# Patient Record
Sex: Female | Born: 1995 | Race: White | Hispanic: No | Marital: Single | Smoking: Never smoker
Health system: Southern US, Community
[De-identification: ages and names within clinical notes are randomized; demographics above are authoritative.]

---
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
## 2017-01-11 ENCOUNTER — Encounter: Payer: Self-pay | Admitting: Pediatrics

## 2017-01-11 ENCOUNTER — Ambulatory Visit (INDEPENDENT_AMBULATORY_CARE_PROVIDER_SITE_OTHER): Payer: Medicaid Other | Admitting: Pediatrics

## 2017-01-11 VITALS — BP 120/85 | HR 59 | Ht 63.39 in | Wt 103.0 lb

## 2017-01-11 DIAGNOSIS — Z3042 Encounter for surveillance of injectable contraceptive: Secondary | ICD-10-CM

## 2017-01-11 DIAGNOSIS — F640 Transsexualism: Secondary | ICD-10-CM

## 2017-01-11 DIAGNOSIS — R634 Abnormal weight loss: Secondary | ICD-10-CM | POA: Diagnosis not present

## 2017-01-11 DIAGNOSIS — Z789 Other specified health status: Secondary | ICD-10-CM

## 2017-01-11 LAB — CBC WITH DIFFERENTIAL/PLATELET
BASOS PCT: 0 %
Basophils Absolute: 0 cells/uL (ref 0–200)
EOS PCT: 0 %
Eosinophils Absolute: 0 cells/uL — ABNORMAL LOW (ref 15–500)
HCT: 47.1 % — ABNORMAL HIGH (ref 34.0–46.0)
Hemoglobin: 15.9 g/dL — ABNORMAL HIGH (ref 11.5–15.3)
LYMPHS PCT: 20 %
Lymphs Abs: 1800 cells/uL (ref 1200–5200)
MCH: 30.3 pg (ref 25.0–35.0)
MCHC: 33.8 g/dL (ref 31.0–36.0)
MCV: 89.7 fL (ref 78.0–98.0)
MONOS PCT: 7 %
MPV: 10.5 fL (ref 7.5–12.5)
Monocytes Absolute: 630 cells/uL (ref 200–900)
Neutro Abs: 6570 cells/uL (ref 1800–8000)
Neutrophils Relative %: 73 %
PLATELETS: 256 10*3/uL (ref 140–400)
RBC: 5.25 MIL/uL — ABNORMAL HIGH (ref 3.80–5.10)
RDW: 13.3 % (ref 11.0–15.0)
WBC: 9 10*3/uL (ref 4.5–13.0)

## 2017-01-11 LAB — COMPREHENSIVE METABOLIC PANEL
ALBUMIN: 4.8 g/dL (ref 3.6–5.1)
ALK PHOS: 69 U/L (ref 47–176)
ALT: 21 U/L (ref 5–32)
AST: 25 U/L (ref 12–32)
BILIRUBIN TOTAL: 0.8 mg/dL (ref 0.2–1.1)
BUN: 8 mg/dL (ref 7–20)
CO2: 28 mmol/L (ref 20–31)
CREATININE: 0.79 mg/dL (ref 0.50–1.00)
Calcium: 9.8 mg/dL (ref 8.9–10.4)
Chloride: 106 mmol/L (ref 98–110)
Glucose, Bld: 75 mg/dL (ref 65–99)
Potassium: 4.9 mmol/L (ref 3.8–5.1)
SODIUM: 143 mmol/L (ref 135–146)
TOTAL PROTEIN: 7.3 g/dL (ref 6.3–8.2)

## 2017-01-11 LAB — LIPID PANEL
CHOL/HDL RATIO: 1.8 ratio (ref <5.0)
Cholesterol: 139 mg/dL (ref <170)
HDL: 77 mg/dL (ref >45)
LDL CALC: 52 mg/dL (ref <110)
Triglycerides: 52 mg/dL (ref <90)
VLDL: 10 mg/dL (ref <30)

## 2017-01-11 MED ORDER — "NEEDLE (DISP) 25G X 1"" MISC": 3 refills | Status: DC

## 2017-01-11 MED ORDER — TESTOSTERONE CYPIONATE 200 MG/ML IM SOLN: INTRAMUSCULAR | 0 refills | Status: DC

## 2017-01-11 MED ORDER — MEDROXYPROGESTERONE ACETATE 150 MG/ML IM SUSP
150.0000 mg | Freq: Once | INTRAMUSCULAR | Status: AC
  Administered 2017-01-11: 150 mg via INTRAMUSCULAR

## 2017-01-11 NOTE — Progress Notes (Signed)
THIS RECORD MAY CONTAIN CONFIDENTIAL INFORMATION THAT SHOULD NOT BE RELEASED WITHOUT REVIEW OF THE SERVICE PROVIDER.  Adolescent Medicine Consultation Follow-Up Visit Wendy Navarro  is a 21 y.o. female referred by Ezzard Flax, MD here today for follow-up regarding MTF trans person, hormone therapy.     Last seen in [facility] on 05/2016 for med management.  Plan at last visit included continue testosterone and depo.  - Pertinent Labs? No - Growth Chart Viewed? yes   History was provided by the patient.  PCP Confirmed?  yes  My Chart Activated?   no   Chief Complaint  Patient presents with  . Follow-up  . Medication Refill  . Contraception    HPI:   Not taking abilify or zoloft anymore. Feels a lot happier since stopping them. Sleep schedule is really good and is not getting irritated easily.  Lost insurance and not seeing therapist.  Had period about 2 months ago but it lasted way longer than it should hav-e about 1 month. Have some significant cramping with it. Worried about it coming back.  About 2 weeks late on testosterone. Before he left here had two vials that were mostly full so it has lasted longer. Sometimes would skip a dose to make it last.  Graduated and is working at [employer] at [place] to save up for a car and think about college. Wants to study [employer].  Eating a lot healthier. Avoiding sugars. Wants to be vegetarian but still eats meat at times. Eats fish still. Avoids red meats.   Still looking at top surgery. Wants to call Dr. Althia Forts in [place].    PHQ-SADS 01/11/2017  PHQ-15 1  GAD-7 0  PHQ-9 0  Suicidal Ideation No   Review of Systems  Constitutional: Negative for malaise/fatigue.  Eyes: Negative for double vision.  Respiratory: Negative for shortness of breath.   Cardiovascular: Negative for chest pain and palpitations.  Gastrointestinal: Negative for abdominal pain, constipation, diarrhea, nausea and vomiting.   Genitourinary: Negative for dysuria.  Musculoskeletal: Negative for joint pain and myalgias.  Skin: Negative for rash.  Neurological: Negative for dizziness and headaches.  Endo/Heme/Allergies: Does not bruise/bleed easily.     No LMP recorded. Allergies  Allergen Reactions  . Cephalosporins Rash   Outpatient Medications Prior to Visit  Medication Sig Dispense Refill  . ARIPiprazole (ABILIFY) 5 MG tablet Take 5 mg by mouth daily.    . Lidocaine-Hydrocortisone Ace 2-2 % KIT Insert one 'Rectal Rocket' PR qHS x 3 nights. 3 each 0  . NEEDLE, DISP, 25 G (B-D DISP NEEDLE 25GX1") 25G X 1" MISC Use q 2 weeks for IM injection of testosterone 50 each 3  . sertraline (ZOLOFT) 50 MG tablet Take 50 mg by mouth daily.    . Syringe, Disposable, 3 ML MISC Use to inject Testosterone as prescribed q 2 weeks 100 each 0  . testosterone cypionate (DEPOTESTOSTERONE CYPIONATE) 200 MG/ML injection INJECT 0.75 MILLILITERS (150MG) INTO THEMUSCLE ONCE EVERY 2 WEEKS 10 mL 0  . triamcinolone ointment (KENALOG) 0.1 % Apply 1 application topically 2 (two) times daily as needed. Do not use on face. Use sparingly. (Patient not taking: Reported on 03/24/2016) 80 g 1   No facility-administered medications prior to visit.      Patient Active Problem List   Diagnosis Date Noted  . 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 Moderate hearing loss of left ear 11/26/2015  . Hemorrhoid 11/26/2015  . Nummular eczema 11/26/2015  . Encounter  for management and injection of injectable progestin contraceptive 05/08/2015  . BMI (body mass index), pediatric, 5% to less than 85% for age 08/15/2014  . Adjustment disorder with mixed anxiety and depressed mood 10/15/2014     Confidentiality was discussed with the patient and if applicable, with caregiver as well.  Tobacco?  no Drugs/ETOH?  yes, MJ occasionally  Partner preference?  female Sexually Active?  no  Pregnancy Prevention:  depo-provera, reviewed condoms & plan  B Trauma currently or in the pastt?  no Suicidal or Self-Harm thoughts?   no Guns in the home?  no    The following portions of the patient's history were reviewed and updated as appropriate: allergies, current medications, past family history, past medical history, past social history and problem list.  Physical Exam:  Vitals:   01/11/17 1531  BP: 120/85  Pulse: (!) 59  Weight: 103 lb (46.7 kg)  Height: 5' 3.39" (1.61 m)   BP 120/85 (BP Location: Right Arm, Patient Position: Sitting, Cuff Size: Large)   Pulse (!) 59   Ht 5' 3.39" (1.61 m)   Wt 103 lb (46.7 kg)   BMI 18.02 kg/m  Body mass index: body mass index is 18.02 kg/m. Blood pressure percentiles are 82 % systolic and 97 % diastolic based on NHBPEP's 4th Report. Blood pressure percentile targets: 90: 124/79, 95: 127/83, 99 + 5 mmHg: 140/95.   Physical Exam  Constitutional: She appears well-developed. No distress.  HENT:  Mouth/Throat: Oropharynx is clear and moist.  Neck: No thyromegaly present.  Cardiovascular: Normal rate and regular rhythm.   No murmur heard. Pulmonary/Chest: Breath sounds normal.  Abdominal: Soft. She exhibits no mass. There is no tenderness. There is no guarding.  Musculoskeletal: She exhibits no edema.  Lymphadenopathy:    She has no cervical adenopathy.  Neurological: She is alert.  Skin: Skin is warm. No rash noted.  Psychiatric: She has a normal mood and affect.  Nursing note and vitals reviewed.   Assessment/Plan: 1. Female-to-female transgender person Continue testosterone at 150 mg every two weeks IM. Repeat depo today. Discussed bleeding and previous labs. May need increase in T to further suppress estradiol. Last T 261. Will repeat labs today and adjust accordingly.  - testosterone cypionate (DEPOTESTOSTERONE CYPIONATE) 200 MG/ML injection; INJECT 0.75 MILLILITERS (150MG) INTO THEMUSCLE ONCE EVERY 2 WEEKS  Dispense: 10 mL; Refill: 0 - NEEDLE, DISP, 25 G (B-D DISP NEEDLE 25GX1") 25G X  1" MISC; Use q 2 weeks for IM injection of testosterone  Dispense: 50 each; Refill: 3 - Comprehensive metabolic panel - CBC with Differential/Platelet - Estradiol - Testos,Total,Free and SHBG (Female) - Lipid panel  2. Encounter for Depo-Provera contraception Will repeat depo today to suppress menstruation. If estradiol becomes suppressed but still bleeding could consider imaging.  - medroxyPROGESTERone (DEPO-PROVERA) injection 150 mg; Inject 1 mL (150 mg total) into the muscle once.  3. Weight loss Has been eating a healthier diet with less sugar. Discussed weight loss today and will monitor. Does not appear to be intentional.    Follow-up:  3 months or sooner if needed.   Medical decision-making:  >25 minutes spent face to face with patient with more than 50% of appointment spent discussing diagnosis, management, follow-up, and reviewing the plan of care as noted above.

## 2017-01-11 NOTE — Patient Instructions (Addendum)
[facility]- Luan Mooreplastic surgeon  [phone]  Continue same dose of testosterone  We will contact you with labs

## 2017-01-12 LAB — ESTRADIOL: Estradiol: 33 pg/mL

## 2017-01-18 LAB — TESTOS,TOTAL,FREE AND SHBG (FEMALE)
Sex Hormone Binding Glob.: 21 nmol/L (ref 17–124)
TESTOSTERONE,FREE: 33.6 pg/mL — AB (ref 0.1–6.4)
Testosterone,Total,LC/MS/MS: 238 ng/dL — ABNORMAL HIGH (ref 2–45)

## 2017-03-30 ENCOUNTER — Other Ambulatory Visit: Payer: Self-pay | Admitting: *Deleted

## 2017-03-30 NOTE — Telephone Encounter (Signed)
Requesting refill for testosterone be called in to [facility] on [address].

## 2017-04-01 ENCOUNTER — Other Ambulatory Visit: Payer: Self-pay | Admitting: Pediatrics

## 2017-04-01 DIAGNOSIS — F640 Transsexualism: Secondary | ICD-10-CM

## 2017-04-01 DIAGNOSIS — Z789 Other specified health status: Secondary | ICD-10-CM

## 2017-04-01 MED ORDER — TESTOSTERONE CYPIONATE 200 MG/ML IM SOLN: INTRAMUSCULAR | 0 refills | Status: DC

## 2017-04-01 NOTE — Telephone Encounter (Signed)
Attempted to contact patient to let them know, unable to leave a voicemail, will try again later.

## 2017-04-01 NOTE — Telephone Encounter (Signed)
Called in to pharmacy. Please let patient know it should be ready for pickup today

## 2017-04-05 ENCOUNTER — Ambulatory Visit: Payer: Medicaid Other | Admitting: Pediatrics

## 2017-06-01 ENCOUNTER — Telehealth: Payer: Self-pay

## 2017-06-01 NOTE — Telephone Encounter (Signed)
Pt called requesting refill of Testosterone. Pt also requesting shot records to be faxed to pharmacy as well. Forwarding to Barnes & NobleCaroline Hacker,NP to review med refill request.

## 2017-06-01 NOTE — Telephone Encounter (Signed)
Returned patients call, no answer, left VM to call office and press option to schedule appointment with provider for f/u. At visit, they could do work up that is necessary. Gave office call back number to call and schedule.

## 2017-06-01 NOTE — Telephone Encounter (Signed)
Called and left VM for patient to call office back to schedule f/u appointment before medication refill request could be approved. Will keep shot records in red pod nurse office until appointment date so patient can pick up then.

## 2017-06-01 NOTE — Telephone Encounter (Signed)
Patient missed last visit and needs labs before med refill can be provided

## 2017-06-07 ENCOUNTER — Ambulatory Visit (INDEPENDENT_AMBULATORY_CARE_PROVIDER_SITE_OTHER): Payer: Medicaid Other | Admitting: Pediatrics

## 2017-06-07 ENCOUNTER — Encounter: Payer: Self-pay | Admitting: Pediatrics

## 2017-06-07 VITALS — BP 114/72 | HR 48 | Ht 63.5 in | Wt 109.2 lb

## 2017-06-07 DIAGNOSIS — Z113 Encounter for screening for infections with a predominantly sexual mode of transmission: Secondary | ICD-10-CM | POA: Diagnosis not present

## 2017-06-07 DIAGNOSIS — F4323 Adjustment disorder with mixed anxiety and depressed mood: Secondary | ICD-10-CM | POA: Diagnosis not present

## 2017-06-07 DIAGNOSIS — Z789 Other specified health status: Secondary | ICD-10-CM

## 2017-06-07 DIAGNOSIS — Z3042 Encounter for surveillance of injectable contraceptive: Secondary | ICD-10-CM | POA: Diagnosis not present

## 2017-06-07 DIAGNOSIS — Z5181 Encounter for therapeutic drug level monitoring: Secondary | ICD-10-CM | POA: Diagnosis not present

## 2017-06-07 DIAGNOSIS — F640 Transsexualism: Secondary | ICD-10-CM | POA: Diagnosis not present

## 2017-06-07 LAB — COMPREHENSIVE METABOLIC PANEL
ALBUMIN: 4.6 g/dL (ref 3.6–5.1)
ALK PHOS: 68 U/L (ref 47–176)
ALT: 7 U/L (ref 5–32)
AST: 12 U/L (ref 12–32)
BILIRUBIN TOTAL: 0.6 mg/dL (ref 0.2–1.1)
BUN: 6 mg/dL — ABNORMAL LOW (ref 7–20)
CALCIUM: 9.8 mg/dL (ref 8.9–10.4)
CO2: 24 mmol/L (ref 20–31)
Chloride: 105 mmol/L (ref 98–110)
Creat: 0.81 mg/dL (ref 0.50–1.00)
GLUCOSE: 86 mg/dL (ref 65–99)
POTASSIUM: 4.9 mmol/L (ref 3.8–5.1)
Sodium: 141 mmol/L (ref 135–146)
TOTAL PROTEIN: 7.1 g/dL (ref 6.3–8.2)

## 2017-06-07 LAB — CBC
HCT: 49 % — ABNORMAL HIGH (ref 34.0–46.0)
Hemoglobin: 16.5 g/dL — ABNORMAL HIGH (ref 11.5–15.3)
MCH: 30.6 pg (ref 25.0–35.0)
MCHC: 33.7 g/dL (ref 31.0–36.0)
MCV: 90.7 fL (ref 78.0–98.0)
MPV: 10.9 fL (ref 7.5–12.5)
PLATELETS: 296 10*3/uL (ref 140–400)
RBC: 5.4 MIL/uL — AB (ref 3.80–5.10)
RDW: 12.7 % (ref 11.0–15.0)
WBC: 7.7 10*3/uL (ref 4.5–13.0)

## 2017-06-07 MED ORDER — TESTOSTERONE CYPIONATE 200 MG/ML IM SOLN: INTRAMUSCULAR | 2 refills | Status: DC

## 2017-06-07 MED ORDER — MEDROXYPROGESTERONE ACETATE 150 MG/ML IM SUSP
150.0000 mg | Freq: Once | INTRAMUSCULAR | Status: AC
  Administered 2017-06-07: 150 mg via INTRAMUSCULAR

## 2017-06-07 NOTE — Progress Notes (Signed)
THIS RECORD MAY CONTAIN CONFIDENTIAL INFORMATION THAT SHOULD NOT BE RELEASED WITHOUT REVIEW OF THE SERVICE PROVIDER.  Adolescent Medicine Consultation Follow-Up Visit Wendy Navarro  is a 21 y.o. female referred by Ezzard Flax, MD here today for follow-up regarding FTM trans person, hormone therapy.    Last seen in [facility] on 01/2017 for med management.  Plan at last visit included continue testosterone and dope.  Pertinent Labs? No Growth Chart Viewed? yes   History was provided by the patient.  Interpreter? yes  PCP Confirmed?  yes  My Chart Activated?   no   Chief Complaint  Patient presents with  . Follow-up    HPI:    Wendy Navarro is doing well. States that he is very happy on his testosterone replacement. Ran out 3 weeks ago, so now off therapy x 1 week. Has been getting smaller vials than previously from his pharmacy and was curious about that. States that the vials get him only through one dose and he has to open a new vial to get enough for second dose. Recommended speaking to the pharmacist about this (probably getting 1 mL vials instead of the prescribed 10 mL vials).   Mood is good, though notes increased fluctuations in mood (anger, mood swings) when he misses a dose of testosterone. He is interested in the option to change from q2week dosing to q1week SQ injections for stabilization of hormone levels. Not on medication for depression/anxiety and feeling good about this.  Last period was a few months ago, states that it was short. He is interested in restarting depo therapy. Has recently moved to [facility] though, so will need to find a provider there to help with injections between his visits here (he would like to continue getting his main healthcare at our clinic).  Currently living in [place]. 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 Working for [employer]. Likes his job ok. [place].  Happy with his weight.  Eating three meals a day.  Per patient and mom, insurance not an issue at this time. Currently in the re-sign up period and think that he should be covered through age 89.   No LMP recorded (lmp unknown). Allergies  Allergen Reactions  . Cephalosporins Rash   Outpatient Medications Prior to Visit  Medication Sig Dispense Refill  . NEEDLE, DISP, 25 G (B-D DISP NEEDLE 25GX1") 25G X 1" MISC Use q 2 weeks for IM injection of testosterone 50 each 3  . Syringe, Disposable, 3 ML MISC Use to inject Testosterone as prescribed q 2 weeks 100 each 0  . testosterone cypionate (DEPOTESTOSTERONE CYPIONATE) 200 MG/ML injection INJECT 0.75 MILLILITERS (150MG) INTO THEMUSCLE ONCE EVERY 2 WEEKS 10 mL 0  . triamcinolone ointment (KENALOG) 0.1 % Apply 1 application topically 2 (two) times daily as needed. Do not use on face. Use sparingly. (Patient not taking: Reported on 03/24/2016) 80 g 1  . Lidocaine-Hydrocortisone Ace 2-2 % KIT Insert one 'Rectal Rocket' PR qHS x 3 nights. (Patient not taking: Reported on 06/07/2017) 3 each 0   No facility-administered medications prior to visit.      Patient Active Problem List   Diagnosis Date Noted  . 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 Moderate hearing loss of left ear 11/26/2015  . Hemorrhoid 11/26/2015  . Nummular eczema 11/26/2015  . Encounter for management and injection of injectable progestin contraceptive 05/08/2015  . BMI (body mass index), pediatric, 5% to less than 85% for age 63/09/2014  .  Adjustment disorder with mixed anxiety and depressed mood 10/15/2014    Social History: Changes with school since last visit?  Yes- graduated, awaiting start at [name] in the fall  Lifestyle habits that can impact QOL: Eating habits/patterns: 3 meals per day   Confidentiality was discussed with the patient and if applicable, with caregiver as well.  Changes at home or school since last visit:  Yes- as above  Gender identity: female Sex assigned at  birth: female Partner preference: males   The following portions of the patient's history were reviewed and updated as appropriate: allergies, current medications, past family history, past medical history, past social history, past surgical history and problem list.  Physical Exam:  Vitals:   06/07/17 1023  BP: 114/72  Pulse: (!) 48  Weight: 49.5 kg (109 lb 3.2 oz)  Height: 5' 3.5" (1.613 m)   Wt Readings from Last 3 Encounters:  06/07/17 49.5 kg (109 lb 3.2 oz) (16 %, Z= -1.01)*  01/11/17 46.7 kg (103 lb) (7 %, Z= -1.45)*  05/17/16 50.4 kg (111 lb 3.2 oz) (23 %, Z= -0.73)*   * Growth percentiles are based on CDC 2-20 Years data.    BP 114/72 (BP Location: Right Arm, Patient Position: Sitting, Cuff Size: Normal)   Pulse (!) 48   Ht 5' 3.5" (1.613 m)   Wt 49.5 kg (109 lb 3.2 oz)   LMP  (LMP Unknown)   BMI 19.04 kg/m  Body mass index: body mass index is 19.04 kg/m. Blood pressure percentiles are 58 % systolic and 77 % diastolic based on the August 2017 AAP Clinical Practice Guideline. Blood pressure percentile targets: 90: 126/78, 95: 129/81, 95 + 12 mmHg: 141/93.   Physical Exam  Constitutional: She is oriented to person, place, and time. She appears well-developed. No distress.  Thin  HENT:  Head: Normocephalic and atraumatic.  Eyes: Conjunctivae are normal. Right eye exhibits no discharge. Left eye exhibits no discharge. No scleral icterus.  Cardiovascular: Normal rate, regular rhythm, normal heart sounds and intact distal pulses.  Exam reveals no gallop and no friction rub.   No murmur heard. Pulmonary/Chest: Effort normal and breath sounds normal. No respiratory distress.  Abdominal: Soft. Bowel sounds are normal. She exhibits no distension. There is no tenderness.  Musculoskeletal: Normal range of motion. She exhibits no deformity.  Neurological: She is alert and oriented to person, place, and time. Coordination normal.  Skin: Skin is warm and dry. No rash noted. No  erythema.  Psychiatric: She has a normal mood and affect.  Nursing note and vitals reviewed.   Assessment/Plan: 1. Female-to-female transgender person - start SQ testosterone injections weekly - f/u in 3 months. Would like to stay with our clinic currently. Will be living in [place], so may eventually need to transition to a provider more close to his primary residence.  - testosterone cypionate (DEPOTESTOSTERONE CYPIONATE) 200 MG/ML injection; INJECT 0.35 MILLILITERS (70MG) INTO THE SUBCUTANEOUS TISSUE ONCE A WEEK  Dispense: 10 mL; Refill: 2  2. Adjustment disorder with mixed anxiety and depressed mood Doing well off medications. No change at this time.  3. Routine screening for STI (sexually transmitted infection) - GC/Chlamydia Probe Amp  4. Medication monitoring encounter- regular f/u for testosterone therapy  - Comprehensive metabolic panel - CBC - Testos,Total,Free and SHBG (Female) - Estradiol  5. Encounter for Depo-Provera contraception- would like to restart depo therapy - medroxyPROGESTERone (DEPO-PROVERA) injection 150 mg; Inject 1 mL (150 mg total) into the muscle once.   Follow-up:  Return in about 3 months (around 09/07/2017).   Medical decision-making:  >15 minutes spent face to face with patient with more than 50% of appointment spent discussing diagnosis, management, follow-up, and reviewing of FTM transgender hormone maintenance, depo contraception, anxiety/depressed mood.

## 2017-06-07 NOTE — Patient Instructions (Addendum)
Dr. Victorino DikeJennifer Abbott  [facility] [address] [place], [place] [address]  Start doing subcutaneous dosing of your testosterone once a week. You will do 0.35 ml now. Look at more videos of subcutaneous and practice like we did today.   We will see you in 3 months!

## 2017-06-08 LAB — ESTRADIOL: ESTRADIOL: 28 pg/mL

## 2017-06-10 LAB — GC/CHLAMYDIA PROBE AMP
CT Probe RNA: NOT DETECTED
GC Probe RNA: NOT DETECTED

## 2017-06-12 LAB — TESTOS,TOTAL,FREE AND SHBG (FEMALE)
Sex Hormone Binding Glob.: 22 nmol/L (ref 17–124)
TESTOSTERONE,FREE: 22.6 pg/mL — AB (ref 0.1–6.4)
TESTOSTERONE,TOTAL,LC/MS/MS: 146 ng/dL — AB (ref 2–45)

## 2017-09-05 ENCOUNTER — Ambulatory Visit: Payer: Self-pay | Admitting: Pediatrics

## 2017-11-19 ENCOUNTER — Other Ambulatory Visit: Payer: Self-pay | Admitting: Pediatrics

## 2017-11-19 DIAGNOSIS — Z789 Other specified health status: Secondary | ICD-10-CM

## 2017-11-19 DIAGNOSIS — F640 Transsexualism: Secondary | ICD-10-CM

## 2017-12-22 ENCOUNTER — Ambulatory Visit: Payer: Self-pay | Admitting: Pediatrics

## 2017-12-29 ENCOUNTER — Ambulatory Visit (INDEPENDENT_AMBULATORY_CARE_PROVIDER_SITE_OTHER): Payer: Medicaid Other | Admitting: Pediatrics

## 2017-12-29 ENCOUNTER — Encounter: Payer: Self-pay | Admitting: Pediatrics

## 2017-12-29 VITALS — BP 106/58 | HR 49 | Ht 63.78 in | Wt 107.9 lb

## 2017-12-29 DIAGNOSIS — F4323 Adjustment disorder with mixed anxiety and depressed mood: Secondary | ICD-10-CM

## 2017-12-29 DIAGNOSIS — F640 Transsexualism: Secondary | ICD-10-CM

## 2017-12-29 DIAGNOSIS — Z789 Other specified health status: Secondary | ICD-10-CM

## 2017-12-29 MED ORDER — SYRINGE (DISPOSABLE) 3 ML MISC: 0 refills | Status: DC

## 2017-12-29 MED ORDER — "NEEDLE (DISP) 25G X 1"" MISC": 3 refills | Status: DC

## 2017-12-29 MED ORDER — NORETHINDRONE ACETATE 5 MG PO TABS: 5.0000 mg | ORAL_TABLET | Freq: Every day | ORAL | 3 refills | Status: DC

## 2017-12-29 MED ORDER — TESTOSTERONE CYPIONATE 200 MG/ML IM SOLN: INTRAMUSCULAR | 0 refills | Status: DC

## 2017-12-29 NOTE — Progress Notes (Signed)
THIS RECORD MAY CONTAIN CONFIDENTIAL INFORMATION THAT SHOULD NOT BE RELEASED WITHOUT REVIEW OF THE SERVICE PROVIDER.  Adolescent Medicine Consultation Follow-Up Visit Wendy Navarro  is a 22 y.o. female referred by Kalman Jewels, MD here today for follow-up regarding FTM trans person hormone therapy   [facility] for med management  Plan at last visit included continue testosterone and Depo  Pertinent Labs? No Growth Chart Viewed? yes   History was provided by the patient and mother.  Interpreter? no  PCP Confirmed?  yes  My Chart Activated?   yes    Chief Complaint  Patient presents with  . Follow-up  . Medication Management    HPI:    Wendy Navarro reports that "things have been alright". He really would like to restart testosterone therapy. He has been out of testosterone since October. When he was on therapy, we preferred the q 2 week vs q 1 week.   Reports his mood is good, but it has been a little stressful. His mother's husband suddenly passed away. Because of this patient moved back to [place] from [place] where he was in school. He is currently working at [employer] and living with his parents to help them financially.   He reports that he had a heavy period about one week ago. Per chart review it looks like the last Depo was in July at the last visit. He reports he would still have some spotting while on Depo and Testosterone.    No LMP recorded. Allergies  Allergen Reactions  . Cephalosporins Rash   Outpatient Medications Prior to Visit  Medication Sig Dispense Refill  . triamcinolone ointment (KENALOG) 0.1 % Apply 1 application topically 2 (two) times daily as needed. Do not use on face. Use sparingly. (Patient not taking: Reported on 03/24/2016) 80 g 1  . NEEDLE, DISP, 25 G (B-D DISP NEEDLE 25GX1") 25G X 1" MISC Use q 2 weeks for IM injection of testosterone (Patient not taking: Reported on 12/29/2017) 50 each 3  . Syringe,  Disposable, 3 ML MISC Use to inject Testosterone as prescribed q 2 weeks (Patient not taking: Reported on 12/29/2017) 100 each 0  . testosterone cypionate (DEPOTESTOSTERONE CYPIONATE) 200 MG/ML injection INJECT 0.75 ML (150MG ) INTO THE MUSCLE EVERY 2 WEEKS (Patient not taking: Reported on 12/29/2017) 10 mL 0   No facility-administered medications prior to visit.      Patient Active Problem List   Diagnosis Date Noted  . 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 Moderate hearing loss of left ear 11/26/2015  . Hemorrhoid 11/26/2015  . Nummular eczema 11/26/2015  . Encounter for management and injection of injectable progestin contraceptive 05/08/2015  . BMI (body mass index), pediatric, 5% to less than 85% for age 85/09/2014  . Adjustment disorder with mixed anxiety and depressed mood 10/15/2014    Social History: Changes with school since last visit?  Yes, please note above   Lifestyle habits that can impact QOL: Sleep: sleeping is okay Eats 3 meals a day    Confidentiality was discussed with the patient and if applicable, with caregiver as well.  Changes at home or school since last visit:  yes  Gender identity: female Sex assigned at birth: female Partner preference?  female   Suicidal or homicidal thoughts?   no Self injurious behaviors?  no     The following portions of the patient's history were reviewed and updated as appropriate: allergies, current medications, past family history, past medical history, past social  history, past surgical history and problem list.  Physical Exam:  Vitals:   12/29/17 0918  BP: (!) 106/58  Pulse: (!) 49  Weight: 107 lb 14.4 oz (48.9 kg)  Height: 5' 3.78" (1.62 m)   BP (!) 106/58   Pulse (!) 49   Ht 5' 3.78" (1.62 m)   Wt 107 lb 14.4 oz (48.9 kg)   BMI 18.65 kg/m  Body mass index: body mass index is 18.65 kg/m. Blood pressure percentiles are 23 % systolic and 15 % diastolic based on the August 2017 AAP Clinical Practice  Guideline. Blood pressure percentile targets: 90: 127/78, 95: 129/81, 95 + 12 mmHg: 141/93.   Physical Exam  Constitutional: She is oriented to person, place, and time. She appears well-developed and well-nourished. No distress.  Eyes: Conjunctivae are normal.  Neck: Normal range of motion. Neck supple.  Cardiovascular: Normal rate and regular rhythm.  No murmur heard. Pulmonary/Chest: Effort normal and breath sounds normal.  Lymphadenopathy:    She has no cervical adenopathy.  Neurological: She is alert and oriented to person, place, and time.  Skin: Skin is warm and dry.  Psychiatric: She has a normal mood and affect. Her behavior is normal. Judgment and thought content normal.    Assessment/Plan:  1. Female-to-female transgender person Will restart testosterone as he has been out since October. Will not get labs today, but will obtain labs in 6 weeks. Follow up in clinic in 3 months  - testosterone cypionate (DEPOTESTOSTERONE CYPIONATE) 200 MG/ML injection; INJECT 0.75 ML (150MG ) INTO THE MUSCLE EVERY 2 WEEKS  Dispense: 10 mL; Refill: 0 - NEEDLE, DISP, 25 G (B-D DISP NEEDLE 25GX1") 25G X 1" MISC; Use q 2 weeks for IM injection of testosterone  Dispense: 50 each; Refill: 3 - Syringe, Disposable, 3 ML MISC; Use to inject Testosterone as prescribed q 2 weeks  Dispense: 100 each; Refill: 0 - start Aygestin 5mg  daily to help with spotting   BH screenings:  reviewed and indicated no depression or anxiety. Screens discussed with patient and parent and adjustments to plan made accordingly.   Follow-up:  Return in about 6 weeks (around 02/09/2018). for lab then in clinic in 3 months   Medical decision-making:  > 15 minutes spent face to face with patient with more than 50% of appointment spent discussing diagnosis, management, follow-up

## 2017-12-29 NOTE — Patient Instructions (Signed)
Thank you for coming in!  We have sent a refill to your pharmacy  Additionally, we discussed staring Aygestin to see if we can stop the irregular spotting. Please take this daily as prescribed.   Please follow up in clinic in 6 weeks

## 2018-02-02 ENCOUNTER — Other Ambulatory Visit: Payer: Medicaid Other

## 2018-03-22 ENCOUNTER — Ambulatory Visit: Payer: Medicaid Other | Admitting: Family

## 2018-10-02 ENCOUNTER — Telehealth: Payer: Self-pay

## 2018-10-02 NOTE — Telephone Encounter (Signed)
Rec'd refill request for pt's testosterone. Pt no showed back in April and never rescheduled. Called and left VM asking for pt to make appointment before any medications can be refilled.

## 2018-10-24 ENCOUNTER — Telehealth: Payer: Self-pay | Admitting: Pediatrics

## 2018-10-24 NOTE — Telephone Encounter (Signed)
Electronic refill request received for patient's testosterone. Needs scheduled appointment on the books prior to refill. We need to be seeing him at least every 6 months.

## 2018-10-24 NOTE — Telephone Encounter (Signed)
Called number on file and left message stating medication cannot be refilled until patient schedules an appointment. Asked for call back to schedule at his convenience.

## 2018-12-18 ENCOUNTER — Encounter: Payer: Self-pay | Admitting: Pediatrics

## 2018-12-18 ENCOUNTER — Ambulatory Visit (INDEPENDENT_AMBULATORY_CARE_PROVIDER_SITE_OTHER): Payer: Medicaid Other | Admitting: Pediatrics

## 2018-12-18 VITALS — BP 129/54 | HR 64 | Ht 63.78 in | Wt 111.8 lb

## 2018-12-18 DIAGNOSIS — Z789 Other specified health status: Secondary | ICD-10-CM

## 2018-12-18 DIAGNOSIS — Z3202 Encounter for pregnancy test, result negative: Secondary | ICD-10-CM

## 2018-12-18 DIAGNOSIS — Z3049 Encounter for surveillance of other contraceptives: Secondary | ICD-10-CM

## 2018-12-18 DIAGNOSIS — Z113 Encounter for screening for infections with a predominantly sexual mode of transmission: Secondary | ICD-10-CM

## 2018-12-18 DIAGNOSIS — F649 Gender identity disorder, unspecified: Secondary | ICD-10-CM

## 2018-12-18 DIAGNOSIS — F640 Transsexualism: Secondary | ICD-10-CM | POA: Diagnosis not present

## 2018-12-18 DIAGNOSIS — Z3042 Encounter for surveillance of injectable contraceptive: Secondary | ICD-10-CM

## 2018-12-18 LAB — POCT URINE PREGNANCY: PREG TEST UR: NEGATIVE

## 2018-12-18 MED ORDER — TESTOSTERONE CYPIONATE 200 MG/ML IM SOLN: INTRAMUSCULAR | 1 refills | Status: DC

## 2018-12-18 MED ORDER — MEDROXYPROGESTERONE ACETATE 150 MG/ML IM SUSP
150.0000 mg | Freq: Once | INTRAMUSCULAR | Status: AC
  Administered 2018-12-18: 150 mg via INTRAMUSCULAR

## 2018-12-18 NOTE — Patient Instructions (Addendum)
Start testosterone at 0.4 ml every week under your skin. Come back and have our nurse do your first shot and help you get the hang of the technique.   [facility]   We did depo here today. This should help stop your periods.   If we can get you back on insurance, we can get you to the surgeon!   [email or web address]

## 2018-12-18 NOTE — Progress Notes (Signed)
History was provided by the patient.  Wendy Navarro is a 23 y.o. adult who is here for [facility] care.  Kalman Jewels, MD   HPI:  Pt reports that he is back today to restart testosterone. Last took T not sure- sometime after the last visit.   Been worried about some physical changes- noticed his breast size is increasing and making him feel really dysphoric. Having a lot of mood swings as a result. He is tearful today in the office and has questions about insurance. He is willing to return for a finanacial counseling visit. He has tried to get back on medicaid, but they have said he needs to be doing some sort of schooling in addition to working. He is currently working for a [employer].   He is having periods that come every few months and continue to be more and more regular and heavier over time. He is interested in menstrual suppression again today. He is amenable to depo or aygestin.  No LMP recorded.  Review of Systems  Constitutional: Negative for malaise/fatigue.  Eyes: Negative for double vision.  Respiratory: Negative for shortness of breath.   Cardiovascular: Negative for chest pain and palpitations.  Gastrointestinal: Negative for abdominal pain, constipation, diarrhea, nausea and vomiting.  Genitourinary: Negative for dysuria.  Musculoskeletal: Negative for joint pain and myalgias.  Skin: Negative for rash.  Neurological: Negative for dizziness and headaches.  Endo/Heme/Allergies: Does not bruise/bleed easily.  Psychiatric/Behavioral: Positive for depression. The patient is nervous/anxious. The patient does not have insomnia.     Patient Active Problem List   Diagnosis Date Noted  . 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 Moderate hearing loss of left ear 11/26/2015  . Hemorrhoid 11/26/2015  . Nummular eczema 11/26/2015  . Encounter for management and injection of injectable progestin contraceptive 05/08/2015  . BMI (body mass index), pediatric, 5% to  less than 85% for age 39/09/2014  . Adjustment disorder with mixed anxiety and depressed mood 10/15/2014    Current Outpatient Medications on File Prior to Visit  Medication Sig Dispense Refill  . NEEDLE, DISP, 25 G (B-D DISP NEEDLE 25GX1") 25G X 1" MISC Use q 2 weeks for IM injection of testosterone (Patient not taking: Reported on 12/18/2018) 50 each 3  . norethindrone (AYGESTIN) 5 MG tablet Take 1 tablet (5 mg total) by mouth daily. (Patient not taking: Reported on 12/18/2018) 30 tablet 3  . Syringe, Disposable, 3 ML MISC Use to inject Testosterone as prescribed q 2 weeks (Patient not taking: Reported on 12/18/2018) 100 each 0  . testosterone cypionate (DEPOTESTOSTERONE CYPIONATE) 200 MG/ML injection INJECT 0.75 ML (150MG ) INTO THE MUSCLE EVERY 2 WEEKS (Patient not taking: Reported on 12/18/2018) 10 mL 0  . triamcinolone ointment (KENALOG) 0.1 % Apply 1 application topically 2 (two) times daily as needed. Do not use on face. Use sparingly. (Patient not taking: Reported on 03/24/2016) 80 g 1   No current facility-administered medications on file prior to visit.     Allergies  Allergen Reactions  . Cephalosporins Rash     Physical Exam:    Vitals:   12/18/18 1458  BP: (!) 129/54  Pulse: 64  Weight: 111 lb 12.8 oz (50.7 kg)  Height: 5' 3.78" (1.62 m)    Growth percentile SmartLinks can only be used for patients less than 77 years old.  Physical Exam Constitutional:      Appearance: He is well-developed.  HENT:     Head: Normocephalic.  Neck:     Thyroid:  No thyromegaly.  Cardiovascular:     Rate and Rhythm: Normal rate and regular rhythm.     Heart sounds: Normal heart sounds.  Pulmonary:     Effort: Pulmonary effort is normal.     Breath sounds: Normal breath sounds.  Abdominal:     General: Bowel sounds are normal.     Palpations: Abdomen is soft.     Tenderness: There is no abdominal tenderness.  Musculoskeletal: Normal range of motion.  Skin:    General: Skin is  warm and dry.  Neurological:     Mental Status: He is alert and oriented to person, place, and time.     Assessment/Plan: 1. Female-to-female transgender person Will switch to subq testosterone. He was comforatble with this today. He will return tomorrow for RN teaching- he has done once before but would like demonstration again.  - testosterone cypionate (DEPOTESTOSTERONE CYPIONATE) 200 MG/ML injection; INJECT 0.4 ML UNDER THE SKIN SUBCUTANEOUSLY EVERY WEEK  Dispense: 20 mL; Refill: 1  2. Gender dysphoria Tearful about breast growth in office today. We discussed potential options for insurance including school + medicaid, [place] marketplace or looking for employment at somewhere like [employer] that has good trans healthcare. He will return tomorrow for financial counseling.   3. Routine screening for STI (sexually transmitted infection) Per protocol.  - C. trachomatis/N. gonorrhoeae RNA  4. Encounter for Depo-Provera contraception Gave in office today- will cover under grant.  - medroxyPROGESTERone (DEPO-PROVERA) injection 150 mg  5. Pregnancy examination or test, negative result Per protocol.  - POCT urine pregnancy

## 2018-12-19 ENCOUNTER — Ambulatory Visit: Payer: Medicaid Other

## 2018-12-19 LAB — C. TRACHOMATIS/N. GONORRHOEAE RNA
C. trachomatis RNA, TMA: NOT DETECTED
N. gonorrhoeae RNA, TMA: NOT DETECTED

## 2019-01-26 ENCOUNTER — Telehealth: Payer: Self-pay | Admitting: *Deleted

## 2019-01-26 ENCOUNTER — Other Ambulatory Visit: Payer: Self-pay | Admitting: Family

## 2019-01-26 DIAGNOSIS — Z789 Other specified health status: Secondary | ICD-10-CM

## 2019-01-26 DIAGNOSIS — F640 Transsexualism: Secondary | ICD-10-CM

## 2019-01-26 MED ORDER — TESTOSTERONE CYPIONATE 200 MG/ML IM SOLN: INTRAMUSCULAR | 1 refills | Status: DC

## 2019-01-26 NOTE — Telephone Encounter (Signed)
Your patient 

## 2019-01-26 NOTE — Telephone Encounter (Signed)
Mom called and she is having trouble getting testoserone filled at pharmacy. She said the [facility], across from [facility] in [place], does carry it. Please escribe to them. It cannot be transferred from original pharmacy since it was never filled. Please call mom on mobile number with questions.

## 2019-06-20 ENCOUNTER — Telehealth: Payer: Self-pay

## 2019-06-20 NOTE — Telephone Encounter (Signed)
Phone call from patient's mom requesting a refill on Testosterone. Pharmacy used is [facility]. Voicemail forwarded to red pod nurse.

## 2019-06-21 NOTE — Telephone Encounter (Signed)
Called and left VM for patient letting them know that patient should have one refill remaining.

## 2019-08-06 ENCOUNTER — Other Ambulatory Visit: Payer: Self-pay | Admitting: Family

## 2019-08-06 DIAGNOSIS — Z789 Other specified health status: Secondary | ICD-10-CM

## 2019-08-06 DIAGNOSIS — F640 Transsexualism: Secondary | ICD-10-CM

## 2019-09-12 ENCOUNTER — Telehealth: Payer: Self-pay

## 2019-09-12 NOTE — Telephone Encounter (Signed)
Mom called asking for refill of IM needles. Routing to provider to assist.

## 2019-09-13 ENCOUNTER — Other Ambulatory Visit: Payer: Self-pay | Admitting: Pediatrics

## 2019-09-13 DIAGNOSIS — Z789 Other specified health status: Secondary | ICD-10-CM

## 2019-09-13 DIAGNOSIS — F640 Transsexualism: Secondary | ICD-10-CM

## 2019-09-13 MED ORDER — "BD DISP NEEDLE 25G X 1"" MISC": 3 refills | Status: DC

## 2019-09-13 NOTE — Telephone Encounter (Signed)
Sent to [facility]

## 2019-09-14 NOTE — Telephone Encounter (Signed)
Sent My-Chart message

## 2021-05-05 ENCOUNTER — Ambulatory Visit
Admission: EM | Admit: 2021-05-05 | Discharge: 2021-05-05 | Disposition: A | Discharge status: Home / Self Care | Payer: Self-pay | Attending: Family Medicine | Admitting: Family Medicine

## 2021-05-05 ENCOUNTER — Encounter: Payer: Self-pay | Admitting: Emergency Medicine

## 2021-05-05 ENCOUNTER — Other Ambulatory Visit: Payer: Self-pay

## 2021-05-05 DIAGNOSIS — K648 Other hemorrhoids: Secondary | ICD-10-CM

## 2021-05-05 MED ORDER — LIDOCAINE (ANORECTAL) 5 % EX GEL: CUTANEOUS | 0 refills | Status: AC

## 2021-05-05 MED ORDER — BETAMETHASONE DIPROPIONATE 0.05 % EX OINT: TOPICAL_OINTMENT | CUTANEOUS | 0 refills | Status: AC

## 2021-05-05 NOTE — ED Provider Notes (Signed)
[facility]    CSN: [id number] Arrival date & time: 05/05/21  1255      History   Chief Complaint Chief Complaint  Patient presents with  . Hemorrhoids    HPI Wendy Navarro is a 25 y.o. adult.   HPI  Patient in today for evaluation of hemorrhoids.  Patient reports pain with defecation.  They have had hemorrhoids in the past and has used creams and other topical solutions without any relief of symptoms. They have never been seen or consulted on by general surgery. History reviewed. No pertinent past medical history.  Patient Active Problem List   Diagnosis Date Noted  . 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 Moderate hearing loss of left ear 11/26/2015  . Hemorrhoid 11/26/2015  . Nummular eczema 11/26/2015  . Encounter for management and injection of injectable progestin contraceptive 05/08/2015  . BMI (body mass index), pediatric, 5% to less than 85% for age 74/09/2014  . Adjustment disorder with mixed anxiety and depressed mood 10/15/2014    History reviewed. No pertinent surgical history.  OB History   No obstetric history on file.      Home Medications    Prior to Admission medications   Medication Sig Start Date End Date Taking? Authorizing Provider  NEEDLE, DISP, 25 G (B-D DISP NEEDLE 25GX1") 25G X 1" MISC Use q 2 weeks for IM injection of testosterone 09/13/19   Alfonso Ramus T, FNP  testosterone cypionate (DEPOTESTOSTERONE CYPIONATE) 200 MG/ML injection INJECT 0.4ML SUB-Q ONCE A WEEK 08/07/19   Georges Mouse, NP    Family History History reviewed. No pertinent family history.  Social History Social History   Tobacco Use  . Smoking status: Passive Smoke Exposure - Never Smoker  . Smokeless tobacco: Never Used     Allergies   Cephalosporins   Review of Systems Review of Systems Pertinent negatives listed in HPI  Physical Exam Triage Vital Signs ED Triage Vitals  Enc Vitals Group     BP 05/05/21 1456 116/79      Pulse Rate 05/05/21 1456 70     Resp 05/05/21 1456 17     Temp 05/05/21 1456 98.5 F (36.9 C)     Temp Source 05/05/21 1456 Oral     SpO2 05/05/21 1456 98 %     Weight --      Height --      Head Circumference --      Peak Flow --      Pain Score 05/05/21 1455 8     Pain Loc --      Pain Edu? --      Excl. in GC? --    No data found.  Updated Vital Signs BP 116/79   Pulse 70   Temp 98.5 F (36.9 C) (Oral)   Resp 17   SpO2 98%   Visual Acuity Right Eye Distance:   Left Eye Distance:   Bilateral Distance:    Right Eye Near:   Left Eye Near:    Bilateral Near:     Physical Exam Constitutional:      Appearance: Normal appearance.  Cardiovascular:     Rate and Rhythm: Normal rate and regular rhythm.  Pulmonary:     Effort: Pulmonary effort is normal.     Breath sounds: Normal breath sounds.  Genitourinary:    Rectum: Guaiac result negative. Anal fissure and external hemorrhoid present. No internal hemorrhoid.  Skin:    General: Skin is warm.     Capillary  Refill: Capillary refill takes less than 2 seconds.  Neurological:     General: No focal deficit present.     Mental Status: He is alert.  Psychiatric:        Mood and Affect: Mood normal.        Behavior: Behavior normal.        Thought Content: Thought content normal.        Judgment: Judgment normal.    Chaperone present during rectal exam  UC Treatments / Results  Labs (all labs ordered are listed, but only abnormal results are displayed) Labs Reviewed - No data to display  EKG   Radiology No results found.  Procedures Procedures (including critical care time)  Medications Ordered in UC Medications - No data to display  Initial Impression / Assessment and Plan / UC Course  I have reviewed the triage vital signs and the nursing notes.  Pertinent labs & imaging results that were available during my care of the patient were reviewed by me and considered in my medical decision making (see  chart for details).    Given prior failures with topical hydrocortisone treatments.  Trialed a topical steroid along with a lidocaine gel for temporary relief of pain.  Advised patient to follow-up with general surgery for surgical consult for removal of hemorrhoids.   Final Clinical Impressions(s) / UC Diagnoses   Final diagnoses:  Other hemorrhoids   Discharge Instructions   None    ED Prescriptions    Medication Sig Dispense Auth. Provider   Lidocaine, Anorectal, 5 % GEL Apply a pea-sized amount to the affected area 3 times daily. 60 g Bing Neighbors, FNP   betamethasone dipropionate (DIPROLENE) 0.05 % ointment Apply pea-sized amount directly to the hemorrhoidal tissue.  Do not use for more than 3 days consecutively. 30 g Bing Neighbors, FNP     PDMP not reviewed this encounter.   Bing Neighbors, FNP 05/10/21 [phone]

## 2021-05-05 NOTE — ED Triage Notes (Addendum)
Pt is present today with hemorrhoids. Pt states that recently they noticed some bleeding and pain. Pt states they notices a sharp pain when sitting down.

## 2021-09-13 ENCOUNTER — Other Ambulatory Visit: Payer: Self-pay

## 2021-09-13 ENCOUNTER — Ambulatory Visit (HOSPITAL_COMMUNITY)
Admission: EM | Admit: 2021-09-13 | Discharge: 2021-09-13 | Disposition: A | Discharge status: Home / Self Care | Payer: Self-pay | Attending: Student | Admitting: Student

## 2021-09-13 ENCOUNTER — Encounter (HOSPITAL_COMMUNITY): Payer: Self-pay | Admitting: *Deleted

## 2021-09-13 DIAGNOSIS — H60502 Unspecified acute noninfective otitis externa, left ear: Secondary | ICD-10-CM

## 2021-09-13 MED ORDER — OFLOXACIN 0.3 % OT SOLN: 2.0000 [drp] | Freq: Two times a day (BID) | OTIC | 0 refills | Status: DC

## 2021-09-13 NOTE — ED Provider Notes (Signed)
[facility]    CSN: [id number] Arrival date & time: 09/13/21  1418      History   Chief Complaint Chief Complaint  Patient presents with   lt ear pain and drainage    HPI Wendy Navarro is a 25 y.o. adult presenting with L ear pain and drainage x months. Medical history L TM perforation requiring surgery years ago.  Describes over 2 months of yellow discharge from the ear with progressively worsening pain and muffled hearing.  Also some vertigo with movement, but none currently.  States that saw some blood from the ear today which concerned him.  Endorses muffled hearing but denies tinnitus.  Denies fever/chills.  HPI  History reviewed. No pertinent past medical history.  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 No pertinent surgical history.  OB History   No obstetric history on file.      Home Medications    Prior to Admission medications   Medication Sig Start Date End Date Taking? Authorizing Provider  ofloxacin (FLOXIN) 0.3 % OTIC solution Place 2 drops into the left ear 2 (two) times daily. 09/13/21  Yes Rhys Martini, PA-C  betamethasone dipropionate (DIPROLENE) 0.05 % ointment Apply pea-sized amount directly to the hemorrhoidal tissue.  Do not use for more than 3 days consecutively. 05/05/21   Bing Neighbors, FNP  Lidocaine, Anorectal, 5 % GEL Apply a pea-sized amount to the affected area 3 times daily. 05/05/21   Bing Neighbors, FNP  NEEDLE, DISP, 25 G (B-D DISP NEEDLE 25GX1") 25G X 1" MISC Use q 2 weeks for IM injection of  testosterone 09/13/19   Alfonso Ramus T, FNP  testosterone cypionate (DEPOTESTOSTERONE CYPIONATE) 200 MG/ML injection INJECT 0.4ML SUB-Q ONCE A WEEK 08/07/19   Georges Mouse, NP    Family History History reviewed. No pertinent family history.  Social History Social History   Tobacco Use   Smoking status: Passive Smoke Exposure - Never Smoker   Smokeless tobacco: Never     Allergies   Cephalosporins   Review of Systems Review of Systems  HENT:  Positive for ear discharge and ear pain.   All other systems reviewed and are negative.   Physical Exam Triage Vital Signs ED Triage Vitals  Enc Vitals Group     BP 09/13/21 1602 121/83     Pulse Rate 09/13/21 1602 (!) 56     Resp 09/13/21 1602 19     Temp 09/13/21 1602 98.3 F (36.8 C)     Temp src --      SpO2 09/13/21 1602 99 %     Weight --      Height --      Head Circumference --      Peak Flow --      Pain Score 09/13/21 1559 8     Pain Loc --      Pain Edu? --      Excl. in GC? --    No data found.  Updated Vital Signs BP 121/83   Pulse (!) 56   Temp 98.3 F (36.8 C)  Resp 19   SpO2 99%   Visual Acuity Right Eye Distance:   Left Eye Distance:   Bilateral Distance:    Right Eye Near:   Left Eye Near:    Bilateral Near:     Physical Exam Vitals reviewed.  Constitutional:      Appearance: Normal appearance. He is not ill-appearing.  HENT:     Head: Normocephalic and atraumatic.     Right Ear: Hearing, ear canal and external ear normal. No swelling or tenderness. No middle ear effusion. There is no impacted cerumen. No mastoid tenderness. Tympanic membrane is scarred. Tympanic membrane is not injected, perforated, erythematous, retracted or bulging.     Left Ear: Hearing, ear canal and external ear normal. Swelling and tenderness present.  No middle ear effusion. There is no impacted cerumen. No mastoid tenderness. Tympanic membrane is scarred. Tympanic membrane is not injected, perforated,  erythematous, retracted or bulging.     Ears:     Comments: Left ear-canal with swelling and white exudate.  Tenderness.  Unable to fully visualize tympanic membrane, cannot exclude perforation.    Mouth/Throat:     Pharynx: Oropharynx is clear. No oropharyngeal exudate or posterior oropharyngeal erythema.  Cardiovascular:     Rate and Rhythm: Normal rate and regular rhythm.     Heart sounds: Normal heart sounds.  Pulmonary:     Effort: Pulmonary effort is normal.     Breath sounds: Normal breath sounds.  Lymphadenopathy:     Cervical: No cervical adenopathy.  Neurological:     General: No focal deficit present.     Mental Status: He is alert and oriented to person, place, and time.  Psychiatric:        Mood and Affect: Mood normal.        Behavior: Behavior normal.        Thought Content: Thought content normal.        Judgment: Judgment normal.     UC Treatments / Results  Labs (all labs ordered are listed, but only abnormal results are displayed) Labs Reviewed - No data to display  EKG   Radiology No results found.  Procedures Procedures (including critical care time)  Medications Ordered in UC Medications - No data to display  Initial Impression / Assessment and Plan / UC Course  I have reviewed the triage vital signs and the nursing notes.  Pertinent labs & imaging results that were available during my care of the patient were reviewed by me and considered in my medical decision making (see chart for details).     This patient is a very pleasant 25 y.o. year old female presenting with L otitis externa. Afebrile, nontachy.  Unable to fully visualize left tympanic membrane, cannot exclude perforation.  History of perforation requiring surgery in the past.  We will manage with ofloxacin drops, follow-up with [place] in about 1 week for recheck. Clean dry ear precautions. May need to see ENT. ED return precautions discussed. Patient verbalizes understanding and agreement.      Final Clinical Impressions(s) / UC Diagnoses   Final diagnoses:  Acute otitis externa of left ear, unspecified type     Discharge Instructions      -I think that you have an external ear infection on the left side.  This is probably causing a lot of the discharge from the ear.  Your canal is so swollen that I cannot exclude a perforated eardrum. -Please start the ofloxacin drops, twice daily for 7 days.  Follow-up  with [place] at the end of treatment to confirm if there is a hole in the eardrum. -Keep the ear dry for the next 7 days, this means an ear plug or cotton ball while in the shower.   ED Prescriptions     Medication Sig Dispense Auth. Provider   ofloxacin (FLOXIN) 0.3 % OTIC solution Place 2 drops into the left ear 2 (two) times daily. 5 mL Rhys Martini, PA-C      PDMP not reviewed this encounter.   Rhys Martini, PA-C 09/13/21 1654

## 2021-09-13 NOTE — Discharge Instructions (Addendum)
-  I think that you have an external ear infection on the left side.  This is probably causing a lot of the discharge from the ear.  Your canal is so swollen that I cannot exclude a perforated eardrum. -Please start the ofloxacin drops, twice daily for 7 days.  Follow-up with [place] at the end of treatment to confirm if there is a hole in the eardrum. -Keep the ear dry for the next 7 days, this means an ear plug or cotton ball while in the shower.

## 2021-09-13 NOTE — ED Triage Notes (Signed)
Pt reports blood from his Lt ear this morning. Marland Kitchen

## 2022-12-29 ENCOUNTER — Encounter: Payer: Self-pay | Admitting: Family Medicine

## 2022-12-29 ENCOUNTER — Ambulatory Visit (INDEPENDENT_AMBULATORY_CARE_PROVIDER_SITE_OTHER): Payer: 59 | Admitting: Family Medicine

## 2022-12-29 DIAGNOSIS — Z789 Other specified health status: Secondary | ICD-10-CM

## 2022-12-29 MED ORDER — "BD ECLIPSE NEEDLE 21G X 1"" MISC": 3 refills | Status: DC

## 2022-12-29 MED ORDER — "BD BLUNT FILTER NEEDLE 18G X 1-1/2"" MISC": 3 refills | Status: DC

## 2022-12-29 MED ORDER — TESTOSTERONE CYPIONATE 200 MG/ML IM SOLN: INTRAMUSCULAR | 0 refills | Status: DC

## 2022-12-29 NOTE — Patient Instructions (Signed)
I have sent in your prescription for testosterone and needles.  Today my nurse showed you have to do the subcutaneous injection which would be weekly and your dose would be 0.4.  If you find this is not what you want to do then we could change back to the intramuscular injection which will be every 2 weeks in the dose will be 0.8.  I would like to see you in 6 weeks at which time we will just check some regular blood work which will probably do then once a year.  If you have questions, please send me a note in my chart.  If it is an urgent question, please call.  As you move forward with your transition if you find you need to carry letter or some other type of documentation, please let me know and I will get that for you.  We are also available to take care of all your primary care needs.  I am not in clinic every day so if it is an urgent issue such as a sore throat, you may see if one of my other partners.  Great to have you!  Please feel free to call with questions or simply use MyChart,

## 2022-12-31 NOTE — Progress Notes (Signed)
    CHIEF COMPLAINT / HPI: New patient to establish care.  Has been in transition female to female for greater than 4 years.  Treatment was interrupted by COVID and by lack of insurance and resources for healthcare.  Has been several months since he had testosterone injection.  Sexual health and transgender history  How do you identify (F, M, FTM, MTF, TRANS, genderqueer, intersex, other)?  Female Preferred pronouns and name: FEMALE  Approximate date of onset of general dysphoria if known: Age 14 or 8 Has Hormonal therapy been started and if so approximate date of initiation: Intermittent therapy with testosterone started for 5 years ago Dosing: Side effects or concerns?  None  Any completed surgical interventions and approximate date if known: No history of surgical intervention but is really interested in having top surgery.  One of the driving factors and getting back on testosterone was 2  Surgical interventions desired or planned?  Yes, wants top surgery soon.  Have you changed your legal name or gender on any of your IDs?  Not yet  Any specific concerns or questions? Signed consent form for hormonal therapy?  We discussed at length.  Signed copy in the chart. PERTINENT  PMH / PSH: I have reviewed the patient's medications, allergies, past medical and surgical history, smoking status and updated in the EMR as appropriate.   OBJECTIVE:  BP 102/64   Pulse 70   Ht 5' 2.5" (1.588 m)   Wt 102 lb (46.3 kg)   LMP 12/20/2022 (Approximate)   BMI 18.36 kg/m   GENERAL: Well-developed, no acute distress CV: Regular rate and rhythm PSYCH: AxOx4. Good eye contact.. No psychomotor retardation or agitation. Appropriate speech fluency and content. Asks and answers questions appropriately. Mood is congruent.  ASSESSMENT / PLAN:   Female-to-female transgender person Instructed in how to do subcutaneous testosterone injections.  Information given for [place] CSF website.  Follow-up 6 weeks.  Will check  labs at that time.   Dorcas Mcmurray MD

## 2022-12-31 NOTE — Assessment & Plan Note (Signed)
Instructed in how to do subcutaneous testosterone injections.  Information given for [place] CSF website.  Follow-up 6 weeks.  Will check labs at that time.

## 2023-01-12 ENCOUNTER — Other Ambulatory Visit: Payer: Self-pay

## 2023-01-12 ENCOUNTER — Emergency Department (HOSPITAL_COMMUNITY)
Admission: EM | Admit: 2023-01-12 | Discharge: 2023-01-12 | Disposition: A | Discharge status: Home / Self Care | Payer: 59 | Attending: Emergency Medicine | Admitting: Emergency Medicine

## 2023-01-12 ENCOUNTER — Ambulatory Visit
Admission: EM | Admit: 2023-01-12 | Discharge: 2023-01-12 | Disposition: A | Discharge status: Home / Self Care | Payer: 59

## 2023-01-12 DIAGNOSIS — K644 Residual hemorrhoidal skin tags: Secondary | ICD-10-CM | POA: Diagnosis not present

## 2023-01-12 DIAGNOSIS — K5900 Constipation, unspecified: Secondary | ICD-10-CM

## 2023-01-12 DIAGNOSIS — K6289 Other specified diseases of anus and rectum: Secondary | ICD-10-CM | POA: Diagnosis present

## 2023-01-12 DIAGNOSIS — K649 Unspecified hemorrhoids: Secondary | ICD-10-CM

## 2023-01-12 LAB — POC OCCULT BLOOD, ED: Fecal Occult Bld: POSITIVE — AB

## 2023-01-12 MED ORDER — OXYCODONE-ACETAMINOPHEN 5-325 MG PO TABS
1.0000 | ORAL_TABLET | Freq: Once | ORAL | Status: AC
  Administered 2023-01-12: 1 via ORAL
  Filled 2023-01-12: qty 1

## 2023-01-12 NOTE — ED Triage Notes (Signed)
Pt arrived via POV. C/o painful hemorrhoid that has been increasingly painful. Interferes w/Bms, and sitting. Noticed some blood when wiping.

## 2023-01-12 NOTE — ED Provider Notes (Signed)
[facility] Provider Note   CSN: [id number] Arrival date & time: 01/12/23  1404     History  Chief Complaint  Patient presents with   Hemorrhoids    Wendy Navarro is a 27 y.o. adult with no significant PMH presenting today for evaluation of rectal pain. Patient states he has had internal and external hemorrhoids for a couple of years. She reports she has had increasing pain and bloody stools x 5 since Saturday. Patient has not seen a GI doctor before. He has tried OTC pain meds with no improvement. Endorses constipation, no diarrhea. She also reports mild RLQ cramping. Patient is having menstrual period. No fever, nausea, vomiting. Patient was seen at urgent care and was sent here for further evaluation due to concern for thrombosed hemorrhoids.  HPI  No past medical history on file. No past surgical history on file.   Home Medications Prior to Admission medications   Medication Sig Start Date End Date Taking? Authorizing Provider  betamethasone dipropionate (DIPROLENE) 0.05 % ointment Apply pea-sized amount directly to the hemorrhoidal tissue.  Do not use for more than 3 days consecutively. 05/05/21   Scot Jun, NP  Lidocaine, Anorectal, 5 % GEL Apply a pea-sized amount to the affected area 3 times daily. 05/05/21   Scot Jun, NP  NEEDLE, DISP, 18 G (BD BLUNT FILTER NEEDLE) 18G X 1-1/2" MISC Use as directed for testosterone injection to fill syringe do not use for injection under skin 12/29/22   Dickie La, MD  NEEDLE, DISP, 21 G (BD ECLIPSE NEEDLE) 21G X 1" MISC Use to inject testosterone subcutaneously as directed each week 12/29/22   Dickie La, MD  NEEDLE, DISP, 25 G (B-D DISP NEEDLE 25GX1") 25G X 1" MISC Use q 2 weeks for IM injection of testosterone 09/13/19   Trude Mcburney, FNP  ofloxacin (FLOXIN) 0.3 % OTIC solution Place 2 drops into the left ear 2 (two) times daily. 09/13/21   Hazel Sams, PA-C   testosterone cypionate (DEPOTESTOSTERONE CYPIONATE) 200 MG/ML injection INJECT 0.4ML SUB-Q ONCE A WEEK 12/29/22   Dickie La, MD      Allergies    Cephalosporins    Review of Systems   Review of Systems Negative except as per HPI.  Physical Exam Updated Vital Signs BP 110/78 (BP Location: Right Arm)   Pulse 91   Temp 98.1 F (36.7 C) (Oral)   Resp 16   Ht 5' 2"$  (1.575 m)   Wt 46.3 kg   LMP 12/20/2022 (Approximate)   SpO2 100%   BMI 18.66 kg/m  Physical Exam Vitals and nursing note reviewed.  Constitutional:      Appearance: Normal appearance.  HENT:     Head: Normocephalic and atraumatic.     Mouth/Throat:     Mouth: Mucous membranes are moist.  Eyes:     General: No scleral icterus. Cardiovascular:     Rate and Rhythm: Normal rate and regular rhythm.     Pulses: Normal pulses.     Heart sounds: Normal heart sounds.  Pulmonary:     Effort: Pulmonary effort is normal.     Breath sounds: Normal breath sounds.  Abdominal:     General: Abdomen is flat.     Palpations: Abdomen is soft.     Tenderness: There is no abdominal tenderness.  Genitourinary:    Comments: Rectal exam was done in the room with [facility] paramedic as chaperone. External hemorrhoid and  internal hemorrhoid noted. No sign of thrombosed hemorrhoid on my exam. No active bleeding. Musculoskeletal:        General: No deformity.  Skin:    General: Skin is warm.     Findings: No rash.  Neurological:     General: No focal deficit present.     Mental Status: He is alert.  Psychiatric:        Mood and Affect: Mood normal.     ED Results / Procedures / Treatments   Labs (all labs ordered are listed, but only abnormal results are displayed) Labs Reviewed  POC OCCULT BLOOD, ED - Abnormal; Notable for the following components:      Result Value   Fecal Occult Bld POSITIVE (*)    All other components within normal limits    EKG None  Radiology No results found.  Procedures Procedures     Medications Ordered in ED Medications  oxyCODONE-acetaminophen (PERCOCET/ROXICET) 5-325 MG per tablet 1 tablet (1 tablet Oral Given 01/12/23 1454)    ED Course/ Medical Decision Making/ A&P                             Medical Decision Making Risk Prescription drug management.   This patient presents to the ED for rectal bleeding, this involves an extensive number of treatment options, and is a complaint that carries with a high risk of complications and morbidity.  The differential diagnosis includes internal hemorrhoid, external hemorrhoid, fistula, laceration, diverticulosis, appendicitis, infectious etiology.  This is not an exhaustive list.  Lab tests: I ordered and personally interpreted labs.  The pertinent results include: POC occult blood positive.  Problem list/ ED course/ Critical interventions/ Medical management: HPI: See above Vital signs within normal range and stable throughout visit. Laboratory/imaging studies significant for: See above. On physical examination, patient is afebrile and appears in no acute distress.  Rectal exam was done with [facility] paramedic as chaperone showed an external hemorrhoid and internal hemorrhoid with no signs of thrombosis or active bleeding.  Low suspicion for fistula or abscess based on rectal exam. Percocet ordered for pain.  I offer nifedipine topical and viscous lidocaine topical for pain however the patient refused.  She only would like to be referred to GI at this point.  I put a referral to [facility] gastroenterology for patient to follow-up.  Advised patient to take Tylenol/ibuprofen/naproxen or Percocet as needed for pain, follow-up with gastroenterology for further evaluation management, return to the ER if new or worsening symptoms. I have reviewed the patient home medicines and have made adjustments as needed.  Cardiac monitoring/EKG: The patient was maintained on a cardiac monitor.  I personally reviewed and interpreted the  cardiac monitor which showed an underlying rhythm of: sinus rhythm.  Additional history obtained: External records from outside source obtained and reviewed including: Chart review including previous notes, labs, imaging.  Disposition Continued outpatient therapy. Follow-up with GI recommended for reevaluation of symptoms. Treatment plan discussed with patient.  Pt acknowledged understanding was agreeable to the plan. Worrisome signs and symptoms were discussed with patient, and patient acknowledged understanding to return to the ED if they noticed these signs and symptoms. Patient was stable upon discharge.   This chart was dictated using voice recognition software.  Despite best efforts to proofread,  errors can occur which can change the documentation meaning.          Final Clinical Impression(s) / ED Diagnoses Final diagnoses:  Hemorrhoids,  unspecified hemorrhoid type    Rx / DC Orders ED Discharge Orders     None         Rex Kras, [place] 01/13/23 1245    Tegeler, Gwenyth Allegra, MD 01/15/23 [phone]

## 2023-01-12 NOTE — Discharge Instructions (Signed)
I recommend close follow-up with gastroenterology for reevaluation.  Please do not hesitate to return to emergency department if worrisome signs symptoms we discussed become apparent.

## 2023-01-12 NOTE — ED Provider Notes (Signed)
Patient here today for significant rectal pain, constipation, and changes in hemorrhoids over the last few days that has prevented sleep, etc due to how significant pain is. He has not had BM for 3-4 days. He reports rectal bleeding. Recommended further evaluation in the ED given concerns for possible thrombosed hemorrhoid, etc. Patient is agreeable to same.    Francene Finders, PA-C 01/12/23 1327

## 2023-01-12 NOTE — ED Triage Notes (Signed)
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 Hard to sleep. Hard to sit. States "a lot" of rectal bleeding when using bathroom.

## 2023-01-12 NOTE — Discharge Instructions (Signed)
  Please report to ED for further evaluation/recommendation.

## 2023-01-14 ENCOUNTER — Encounter: Payer: Self-pay | Admitting: Gastroenterology

## 2023-02-09 ENCOUNTER — Ambulatory Visit (INDEPENDENT_AMBULATORY_CARE_PROVIDER_SITE_OTHER): Payer: 59 | Admitting: Family Medicine

## 2023-02-09 ENCOUNTER — Encounter: Payer: Self-pay | Admitting: Family Medicine

## 2023-02-09 DIAGNOSIS — Z789 Other specified health status: Secondary | ICD-10-CM | POA: Diagnosis not present

## 2023-02-09 MED ORDER — "BD BLUNT FILTER NEEDLE 18G X 1-1/2"" MISC": 3 refills | Status: AC

## 2023-02-09 MED ORDER — "BD DISP NEEDLE 25G X 1"" MISC": 3 refills | Status: DC

## 2023-02-09 MED ORDER — TESTOSTERONE CYPIONATE 200 MG/ML IM SOLN: INTRAMUSCULAR | 5 refills | Status: DC

## 2023-02-09 NOTE — Patient Instructions (Signed)
I have sent in your refills.  If you need refills or anything before I see you next, please give [place] a call or use MyChart to send me a message.  We are scheduling you to see me back next week for Nexplanon.   I think this will probably be exactly what you are looking for.  If it does not, it is a simple matter to take it out.  Great to see you!

## 2023-02-10 LAB — CBC
Hematocrit: 41 % (ref 34.0–46.6)
Hemoglobin: 13.8 g/dL (ref 11.1–15.9)
MCH: 30.2 pg (ref 26.6–33.0)
MCHC: 33.7 g/dL (ref 31.5–35.7)
MCV: 90 fL (ref 79–97)
Platelets: 198 10*3/uL (ref 150–450)
RBC: 4.57 x10E6/uL (ref 3.77–5.28)
RDW: 12.1 % (ref 11.7–15.4)
WBC: 4.7 10*3/uL (ref 3.4–10.8)

## 2023-02-10 LAB — TESTOSTERONE: Testosterone: 214 ng/dL — ABNORMAL HIGH (ref 13–71)

## 2023-02-11 NOTE — Assessment & Plan Note (Signed)
Labs Continue current meds Refill needles and testosterone Discussed menstrual suppression Will schedule for nexplanon

## 2023-02-11 NOTE — Progress Notes (Signed)
    CHIEF COMPLAINT / HPI:  F/u gender affirming hormone therapy. Doing well.wants to discuss further mensrual suppression with depo provera or similar as having some occasional spotting.   PERTINENT  PMH / PSH: I have reviewed the patient's medications, allergies, past medical and surgical history, smoking status and updated in the EMR as appropriate.   OBJECTIVE:  BP 100/62   Pulse (!) 56   Ht 5' 2.5" (1.588 m)   Wt 101 lb 6.4 oz (46 kg)   LMP 01/18/2023 (Approximate)   SpO2 99%   BMI 18.25 kg/m  Vital signs reviewed. GENERAL: Well-developed, well-nourished, no acute distress. CARDIOVASCULAR: Regular rate and rhythm MSK: Movement of extremity x 4.   ASSESSMENT / PLAN:   Female-to-female transgender person Labs Continue current meds Refill needles and testosterone Discussed menstrual suppression Will schedule for nexplanon   Dorcas Mcmurray MD

## 2023-02-17 ENCOUNTER — Ambulatory Visit: Payer: 59

## 2023-02-18 ENCOUNTER — Encounter: Payer: Self-pay | Admitting: Family Medicine

## 2023-02-22 ENCOUNTER — Ambulatory Visit: Payer: 59 | Admitting: Gastroenterology

## 2023-02-24 ENCOUNTER — Ambulatory Visit: Payer: 59

## 2023-03-16 ENCOUNTER — Ambulatory Visit (INDEPENDENT_AMBULATORY_CARE_PROVIDER_SITE_OTHER): Payer: 59 | Admitting: Family Medicine

## 2023-03-16 ENCOUNTER — Encounter: Payer: Self-pay | Admitting: Family Medicine

## 2023-03-16 DIAGNOSIS — Z789 Other specified health status: Secondary | ICD-10-CM | POA: Diagnosis not present

## 2023-03-16 MED ORDER — TESTOSTERONE CYPIONATE 200 MG/ML IM SOLN: INTRAMUSCULAR | 5 refills | Status: DC

## 2023-03-16 MED ORDER — BD DISP NEEDLE 25G X 1" MISC
3 refills | Status: AC
Start: 2023-03-16 — End: ?

## 2023-03-16 NOTE — Assessment & Plan Note (Signed)
Reviewed his labs.  After he has been on the alternating regimen of the AndroGel for about 4 weeks, he will come in for a CBC to check his hematocrit.  Otherwise we will leave medications the same and he will follow-up in about 6 months.  I will get him a trans carry letter sent to him through MyChart.  He is still interested in Nexplanon placement and we will set that up.

## 2023-03-16 NOTE — Progress Notes (Signed)
    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  Doing well.  Pleased with continued transition.  Has decided he would like to pursue top surgery.  Will need to carry letter.  Has been alternating AndroGel as we discussed.  PERTINENT  PMH / PSH: I have reviewed the patient's medications, allergies, past medical and surgical history, smoking status and updated in the EMR as appropriate.   OBJECTIVE:  BP 102/64   Pulse 71   Wt 95 lb 6.4 oz (43.3 kg)   SpO2 99%   BMI 17.17 kg/m    ASSESSMENT / PLAN:   Female-to-female transgender person Reviewed his labs.  After he has been on the alternating regimen of the AndroGel for about 4 weeks, he will come in for a CBC to check his hematocrit.  Otherwise we will leave medications the same and he will follow-up in about 6 months.  I will get him a trans carry letter sent to him through MyChart.  He is still interested in Nexplanon placement and we will set that up.   Denny Levy MD

## 2023-03-16 NOTE — Patient Instructions (Signed)
Great to see you. Let me see you oin about 4-6 months.

## 2023-03-31 ENCOUNTER — Ambulatory Visit: Payer: 59

## 2023-10-15 ENCOUNTER — Other Ambulatory Visit: Payer: Self-pay | Admitting: Family Medicine

## 2023-10-15 DIAGNOSIS — Z789 Other specified health status: Secondary | ICD-10-CM

## 2024-07-23 ENCOUNTER — Other Ambulatory Visit: Payer: Self-pay | Admitting: Family Medicine

## 2024-07-23 DIAGNOSIS — Z789 Other specified health status: Secondary | ICD-10-CM

## 2024-10-01 ENCOUNTER — Other Ambulatory Visit: Payer: Self-pay

## 2024-10-01 ENCOUNTER — Encounter: Payer: Self-pay | Admitting: *Deleted

## 2024-10-01 ENCOUNTER — Ambulatory Visit
Admission: EM | Admit: 2024-10-01 | Discharge: 2024-10-01 | Disposition: A | Discharge status: Home / Self Care | Attending: Student | Admitting: Student

## 2024-10-01 DIAGNOSIS — H6121 Impacted cerumen, right ear: Secondary | ICD-10-CM

## 2024-10-01 MED ORDER — OFLOXACIN 0.3 % OT SOLN: 5.0000 [drp] | Freq: Two times a day (BID) | OTIC | 0 refills | Status: AC

## 2024-10-01 NOTE — ED Provider Notes (Signed)
 [facility]    CSN: [id number] Arrival date & time: 10/01/24  0857      History   Chief Complaint Chief Complaint  Patient presents with   Ear Fullness    HPI Wendy Navarro is a 28 y.o. adult presenting with right ear fullness for about 2 days.  Medical history as below.  Describes right ear fullness, pressure, decreased hearing.  Denies recent URI.  Denies cough, congestion, fever.  Attempted to use hydrogen peroxide without much relief.  HPI  No past medical history on file.  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  OB History   No obstetric history on file.      Home Medications    Prior to Admission medications   Medication Sig Start Date End Date Taking? Authorizing Provider  ofloxacin  (FLOXIN ) 0.3 % OTIC solution Place 5 drops into the right ear 2 (two) times daily for 7 days. 10/01/24 10/08/24 Yes Alexyss Balzarini E, PA-C  testosterone  cypionate (DEPOTESTOSTERONE CYPIONATE) 200 MG/ML injection INJECT 0.4ML UNDER THE SKIN ONCE WEEKLY 07/24/24  Yes Rosalynn Camie CROME, MD  betamethasone  dipropionate (DIPROLENE ) 0.05 % ointment Apply pea-sized amount directly to the hemorrhoidal tissue.  Do not use for more than 3 days consecutively. 05/05/21   Arloa Suzen RAMAN, NP  Lidocaine , Anorectal, 5 % GEL Apply a pea-sized amount to the affected area 3 times daily. 05/05/21   Arloa Suzen RAMAN, NP  NEEDLE, DISP, 18 G (BD BLUNT FILTER NEEDLE) 18G X 1-1/2 MISC Use as directed for testosterone  injection to fill syringe do not use for injection under skin 02/09/23   Rosalynn Camie CROME, MD  NEEDLE, DISP, 25 G (B-D DISP NEEDLE 25GX1) 25G X 1  MISC Use q 2 weeks for IM injection of testosterone  03/16/23   Rosalynn Camie CROME, MD    Family History No family history on file.  Social History Social History   Tobacco Use   Smoking status: Never    Passive exposure: Yes   Smokeless tobacco: Never  Vaping Use   Vaping status: Some Days  Substance Use Topics   Alcohol use: Not Currently   Drug use: Yes    Types: Marijuana    Comment: occasional     Allergies   Cephalosporins   Review of Systems Review of Systems  Constitutional:  Negative for appetite change, chills and fever.  HENT:  Positive for hearing loss. Negative for congestion, ear pain, rhinorrhea, sinus pressure, sinus pain and sore throat.   Eyes:  Negative for redness and visual disturbance.  Respiratory:  Negative for cough, chest tightness, shortness of breath and wheezing.   Cardiovascular:  Negative for chest pain and palpitations.  Gastrointestinal:  Negative for abdominal pain, constipation, diarrhea, nausea and vomiting.  Genitourinary:  Negative for dysuria, frequency and urgency.  Musculoskeletal:  Negative for myalgias.  Neurological:  Negative for dizziness, weakness and headaches.  Psychiatric/Behavioral:  Negative for confusion.   All other systems reviewed and are negative.    Physical Exam Triage Vital Signs ED Triage Vitals  Encounter Vitals Group     BP --      Girls Systolic BP Percentile --  Girls Diastolic BP Percentile --      Boys Systolic BP Percentile --      Boys Diastolic BP Percentile --      Pulse Rate 10/01/24 1025 (!) 52     Resp 10/01/24 1025 18     Temp 10/01/24 1025 98 F (36.7 C)     Temp Source 10/01/24 1025 Oral     SpO2 10/01/24 1025 98 %     Weight --      Height --      Head Circumference --      Peak Flow --      Pain Score 10/01/24 1020 5     Pain Loc --      Pain Education --      Exclude from Growth Chart --    No data found.  Updated Vital Signs Pulse (!) 52   Temp 98 F (36.7 C) (Oral)    Resp 18   SpO2 98%   Visual Acuity Right Eye Distance:   Left Eye Distance:   Bilateral Distance:    Right Eye Near:   Left Eye Near:    Bilateral Near:     Physical Exam Vitals reviewed.  Constitutional:      Appearance: Normal appearance. He is not ill-appearing.  HENT:     Head: Normocephalic and atraumatic.     Right Ear: Hearing, tympanic membrane, ear canal and external ear normal. No swelling or tenderness. No middle ear effusion. There is impacted cerumen. No mastoid tenderness. Tympanic membrane is not injected, scarred, perforated, erythematous, retracted or bulging.     Left Ear: Hearing, tympanic membrane, ear canal and external ear normal. No swelling or tenderness.  No middle ear effusion. There is no impacted cerumen. No mastoid tenderness. Tympanic membrane is not injected, scarred, perforated, erythematous, retracted or bulging.     Ears:     Comments: Right tympanic membrane is initially fully occluded by cerumen.  Following lavage, canal is erythematous. TM is pearly gray and intact.    Mouth/Throat:     Pharynx: Oropharynx is clear. No oropharyngeal exudate or posterior oropharyngeal erythema.  Cardiovascular:     Rate and Rhythm: Normal rate and regular rhythm.     Heart sounds: Normal heart sounds.  Pulmonary:     Effort: Pulmonary effort is normal.     Breath sounds: Normal breath sounds.  Lymphadenopathy:     Cervical: No cervical adenopathy.  Neurological:     General: No focal deficit present.     Mental Status: He is alert and oriented to person, place, and time.  Psychiatric:        Mood and Affect: Mood normal.        Behavior: Behavior normal.        Thought Content: Thought content normal.        Judgment: Judgment normal.      UC Treatments / Results  Labs (all labs ordered are listed, but only abnormal results are displayed) Labs Reviewed - No data to display  EKG   Radiology No results found.  Procedures Procedures (including  critical care time)  Medications Ordered in UC Medications - No data to display  Initial Impression / Assessment and Plan / UC Course  I have reviewed the triage vital signs and the nursing notes.  Pertinent labs & imaging results that were available during my care of the patient were reviewed by me and considered in my medical decision making (see chart for details).  Patient is a 28 year old presenting with right cerumen impaction.  No recent URI.  Following lavage, canal is erythematous and inflamed; tympanic membrane is healthy and intact.  Ofloxacin  drops sent to prevent otitis externa.  Clean dry ear precautions.  Final Clinical Impressions(s) / UC Diagnoses   Final diagnoses:  Right ear impacted cerumen     Discharge Instructions      -Ofloxacin  drops twice daily x7 days -Keep the ear dry for the next week     ED Prescriptions     Medication Sig Dispense Auth. Provider   ofloxacin  (FLOXIN ) 0.3 % OTIC solution Place 5 drops into the right ear 2 (two) times daily for 7 days. 5 mL Arlyss Leita BRAVO, PA-C      PDMP not reviewed this encounter.   Arlyss Leita BRAVO, PA-C 10/01/24 1156

## 2024-10-01 NOTE — ED Triage Notes (Signed)
 Right ear fullness x 2 days. He used peroxide today without relief. States unable to hear at all for right ear. States he has prior hearing loss in his left ear

## 2024-10-01 NOTE — Discharge Instructions (Signed)
-  Ofloxacin  drops twice daily x7 days -Keep the ear dry for the next week

## 2024-10-16 ENCOUNTER — Other Ambulatory Visit: Payer: Self-pay

## 2024-10-16 ENCOUNTER — Ambulatory Visit
Admission: EM | Admit: 2024-10-16 | Discharge: 2024-10-16 | Disposition: A | Discharge status: Home / Self Care | Attending: Student | Admitting: Student

## 2024-10-16 ENCOUNTER — Encounter: Payer: Self-pay | Admitting: Emergency Medicine

## 2024-10-16 DIAGNOSIS — R112 Nausea with vomiting, unspecified: Secondary | ICD-10-CM

## 2024-10-16 MED ORDER — ONDANSETRON 4 MG PO TBDP: 4.0000 mg | ORAL_TABLET | Freq: Three times a day (TID) | ORAL | 0 refills | Status: AC | PRN

## 2024-10-16 MED ORDER — ONDANSETRON HCL 4 MG/2ML IJ SOLN
4.0000 mg | Freq: Once | INTRAMUSCULAR | Status: AC
  Administered 2024-10-16: 4 mg via INTRAMUSCULAR

## 2024-10-16 MED ORDER — ONDANSETRON 4 MG PO TBDP
4.0000 mg | ORAL_TABLET | Freq: Once | ORAL | Status: AC
  Administered 2024-10-16: 4 mg via ORAL

## 2024-10-16 NOTE — ED Triage Notes (Signed)
 Pt here for vomiting x 1 week with nausea; pt sts able to hold down liquids but unable to eat food; pt is tearful

## 2024-10-16 NOTE — ED Provider Notes (Signed)
 [facility]    CSN: [id number] Arrival date & time: 10/16/24  9175      History   Chief Complaint Chief Complaint  Patient presents with   Vomiting    HPI Wendy Navarro is a 28 y.o. adult presenting with vomiting 1 week w nausea. Pt here for vomiting x 1 week with nausea; pt sts able to hold down liquids but unable to eat food; pt is tearful. One episode of emesis today. Describes frequent emesis. Initially with diarrhea; last BM was 2 days ago and was loose. Fever on day #1 but no longer. Has had an anti nausea medicine at home, unsure what this was. Currently menstruating. Rates epigastric pain 5/10. Denies urinary symptoms. Denies hematemesis, hematochezia, melena. Denies prior h/o abd procedure.     HPI  History reviewed. No pertinent past medical history.  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 No pertinent surgical history.  OB History   No obstetric history on file.      Home Medications    Prior to Admission medications   Medication Sig Start Date End Date Taking? Authorizing Provider  ondansetron (ZOFRAN-ODT) 4 MG disintegrating tablet Take 1 tablet (4 mg total) by mouth every 8 (eight) hours as needed for nausea or vomiting. 10/16/24  Yes Nathanyl Andujo E, PA-C  betamethasone  dipropionate (DIPROLENE ) 0.05 % ointment Apply pea-sized amount directly to the hemorrhoidal tissue.  Do not use for more than 3 days consecutively. 05/05/21   Arloa Suzen RAMAN, NP  Lidocaine , Anorectal, 5 % GEL Apply a pea-sized amount to the affected area 3 times daily. 05/05/21   Arloa Suzen RAMAN, NP  NEEDLE, DISP, 18 G (BD BLUNT FILTER NEEDLE)  18G X 1-1/2 MISC Use as directed for testosterone  injection to fill syringe do not use for injection under skin 02/09/23   Rosalynn Camie CROME, MD  NEEDLE, DISP, 25 G (B-D DISP NEEDLE 25GX1) 25G X 1 MISC Use q 2 weeks for IM injection of testosterone  03/16/23   Rosalynn Camie CROME, MD  testosterone  cypionate (DEPOTESTOSTERONE CYPIONATE) 200 MG/ML injection INJECT 0.4ML UNDER THE SKIN ONCE WEEKLY 07/24/24   Rosalynn Camie CROME, MD    Family History History reviewed. No pertinent family history.  Social History Social History   Tobacco Use   Smoking status: Never    Passive exposure: Yes   Smokeless tobacco: Never  Vaping Use   Vaping status: Some Days  Substance Use Topics   Alcohol use: Not Currently   Drug use: Yes    Types: Marijuana    Comment: occasional     Allergies   Cephalosporins   Review of Systems Review of Systems  Constitutional:  Negative for appetite change, chills, diaphoresis, fever and unexpected weight change.  HENT:  Negative for congestion, ear pain, sinus pressure, sinus pain, sneezing, sore throat and trouble swallowing.   Respiratory:  Negative for cough, chest tightness and shortness of breath.   Cardiovascular:  Negative for chest pain.  Gastrointestinal:  Positive for abdominal pain, diarrhea, nausea and vomiting. Negative for abdominal distention, anal bleeding, blood in stool, constipation and rectal pain.  Genitourinary:  Negative for dysuria, flank pain, frequency and urgency.  Musculoskeletal:  Negative for back pain and myalgias.  Neurological:  Negative for dizziness, light-headedness and headaches.     Physical Exam Triage Vital Signs ED Triage Vitals  Encounter Vitals Group     BP 10/16/24 0907 (!) 156/96     Girls Systolic BP Percentile --      Girls Diastolic BP Percentile --      Boys Systolic BP Percentile --      Boys Diastolic BP Percentile --      Pulse Rate 10/16/24 0907 92     Resp 10/16/24 0907 18     Temp 10/16/24 0907 98.8 F (37.1 C)      Temp Source 10/16/24 0907 Oral     SpO2 10/16/24 0907 98 %     Weight --      Height --      Head Circumference --      Peak Flow --      Pain Score 10/16/24 0908 5     Pain Loc --      Pain Education --      Exclude from Growth Chart --    No data found.  Updated Vital Signs BP (!) 156/96 (BP Location: Left Arm)   Pulse 92   Temp 98.8 F (37.1 C) (Oral)   Resp 18   SpO2 98%   Visual Acuity Right Eye Distance:   Left Eye Distance:   Bilateral Distance:    Right Eye Near:   Left Eye Near:    Bilateral Near:     Physical Exam Vitals reviewed.  Constitutional:      General: He is not in acute distress.    Appearance: Normal appearance. He is not ill-appearing.  HENT:     Head: Normocephalic and atraumatic.     Mouth/Throat:     Mouth: Mucous membranes are moist.  Eyes:     Extraocular Movements: Extraocular movements intact.     Pupils: Pupils are equal, round, and reactive to light.  Cardiovascular:     Rate and Rhythm: Normal rate and regular rhythm.     Heart sounds: Normal heart sounds.  Pulmonary:     Effort: Pulmonary effort is normal.     Breath sounds: Normal breath sounds. No wheezing, rhonchi or rales.  Abdominal:     General: Bowel sounds are normal. There is no distension.     Palpations: Abdomen is soft. There is no mass.     Tenderness: There is abdominal tenderness in the epigastric area. There is no right CVA tenderness, left CVA tenderness, guarding or rebound. Negative signs include Murphy's sign, Rovsing's sign and McBurney's sign.     Comments: Epigastric pain to palpation, without guarding or rebound.   Skin:    General: Skin is warm.     Capillary Refill: Capillary refill takes less than 2 seconds.  Neurological:     General: No focal deficit present.     Mental Status: He is alert and oriented to person, place, and time.  Psychiatric:        Mood and Affect: Mood normal.        Behavior: Behavior normal.      UC Treatments /  Results  Labs (all labs ordered are listed, but only abnormal results are displayed) Labs Reviewed - No data to display  EKG   Radiology No results found.  Procedures Procedures (including critical care time)  Medications Ordered in UC Medications  ondansetron (ZOFRAN-ODT) disintegrating tablet 4 mg (4 mg Oral  Given 10/16/24 0911)  ondansetron (ZOFRAN) injection 4 mg (4 mg Intramuscular Given 10/16/24 0958)    Initial Impression / Assessment and Plan / UC Course  I have reviewed the triage vital signs and the nursing notes.  Pertinent labs & imaging results that were available during my care of the patient were reviewed by me and considered in my medical decision making (see chart for details).     Patient presenting with nausea with vomiting for 1 week. The patient is afebrile and nontachycardic.  Antipyretic has not been administered today.  No prior history of abdominal procedure.  1 episode of vomiting so far today.  Denies hematemesis, melena, hematochezia. Epigastric pain is rated to be 5/10.  Upon presentation, Zofran ODT was provided, but the patient vomited this up.  Subsequently, Zofran IM was administered, and we gave the patient a 25-minute PO challenge. They are now tolerating fluids. Discharged to home with Zofran ODT prescription and strict return precautions.   Final Clinical Impressions(s) / UC Diagnoses   Final diagnoses:  Nausea and vomiting, unspecified vomiting type     Discharge Instructions      -Take the Zofran (ondansetron) up to 3 times daily for nausea and vomiting. Dissolve one pill under your tongue or between your teeth and your cheek. -Drink plenty of fluids and eat a bland diet as tolerated. - If you develop new or changing symptoms, like severe abdominal pain, new blood in the vomit, coffee-ground-like vomit, blood in your poop, black poop-head to the emergency department.     ED Prescriptions     Medication Sig Dispense Auth.  Provider   ondansetron (ZOFRAN-ODT) 4 MG disintegrating tablet Take 1 tablet (4 mg total) by mouth every 8 (eight) hours as needed for nausea or vomiting. 20 tablet Ellison Rieth E, PA-C      PDMP not reviewed this encounter.   Arlyss Leita BRAVO, PA-C 10/16/24 1034

## 2024-10-16 NOTE — Discharge Instructions (Addendum)
-  Take the Zofran (ondansetron) up to 3 times daily for nausea and vomiting. Dissolve one pill under your tongue or between your teeth and your cheek. -Drink plenty of fluids and eat a bland diet as tolerated. - If you develop new or changing symptoms, like severe abdominal pain, new blood in the vomit, coffee-ground-like vomit, blood in your poop, black poop-head to the emergency department.

## 2025-01-14 ENCOUNTER — Ambulatory Visit (HOSPITAL_COMMUNITY): Payer: Self-pay | Admitting: Clinical
